# Patient Record
Sex: Male | Born: 2000 | Race: Black or African American | Hispanic: No | Marital: Single | Smoking: Never smoker
Health system: Southern US, Community
[De-identification: ages and names within clinical notes are randomized; demographics above are authoritative.]

---
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
## 2022-03-15 IMAGING — CR DG ANKLE COMPLETE 3+V*R*
3 series · 3 of 3 positions shown · non-contrast
Comparison: None.

CLINICAL DATA: 19-year-old male status post soccer injury 3 days
ago with continued lateral pain.

EXAM:
RIGHT ANKLE - COMPLETE 3+ VIEW

[x ankle ap right]
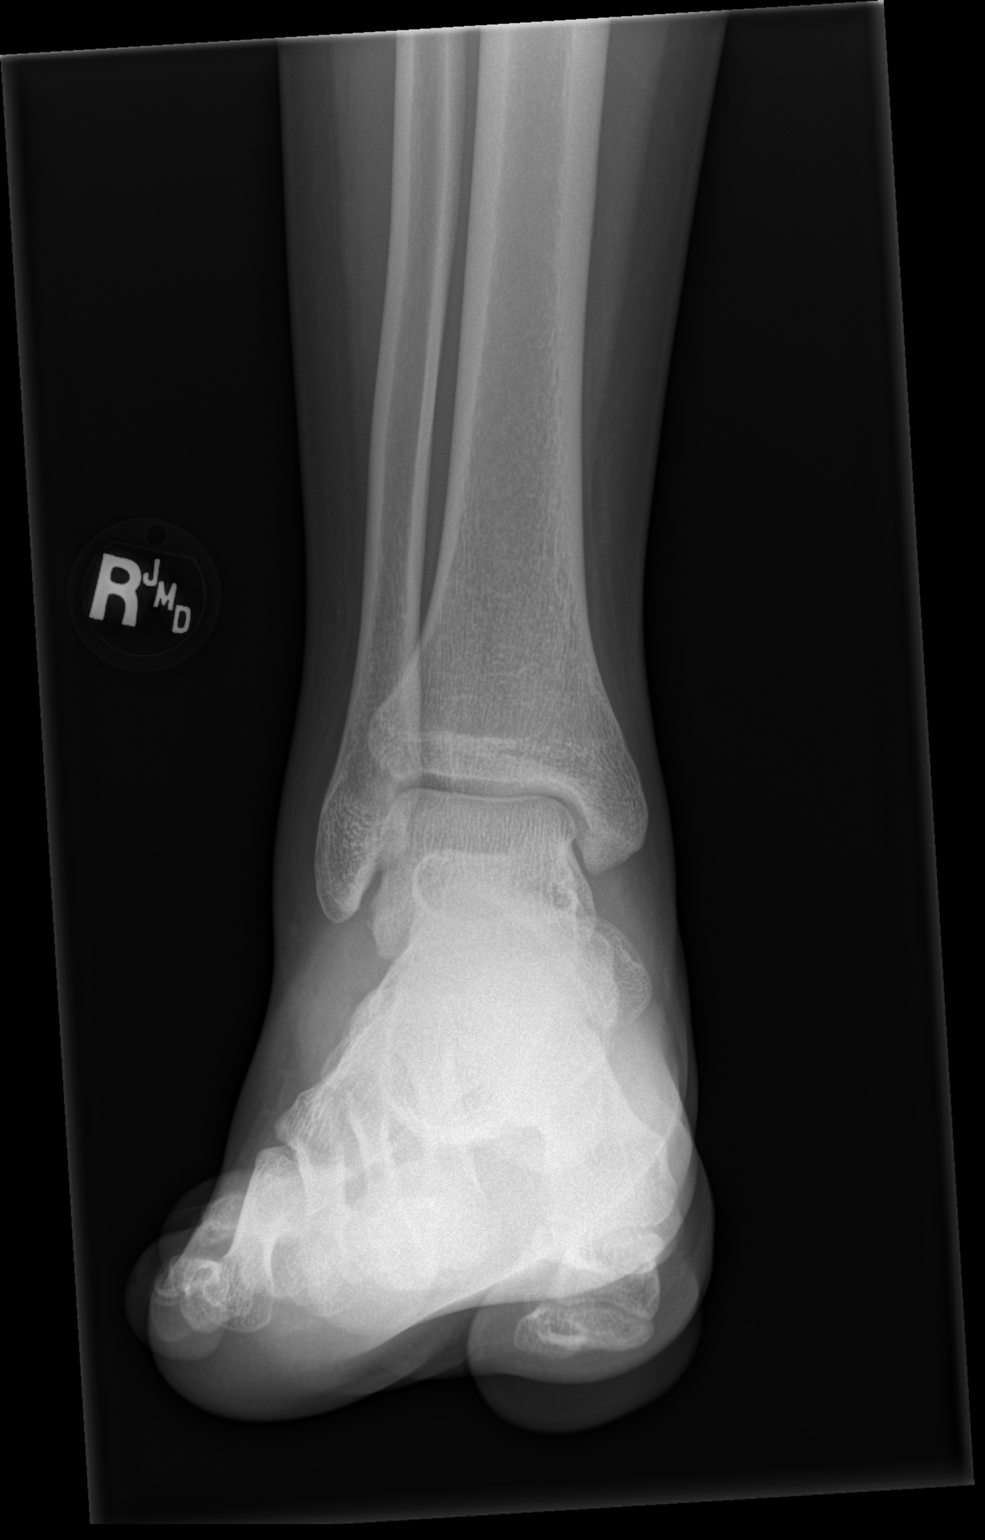

[x ankle obl right]
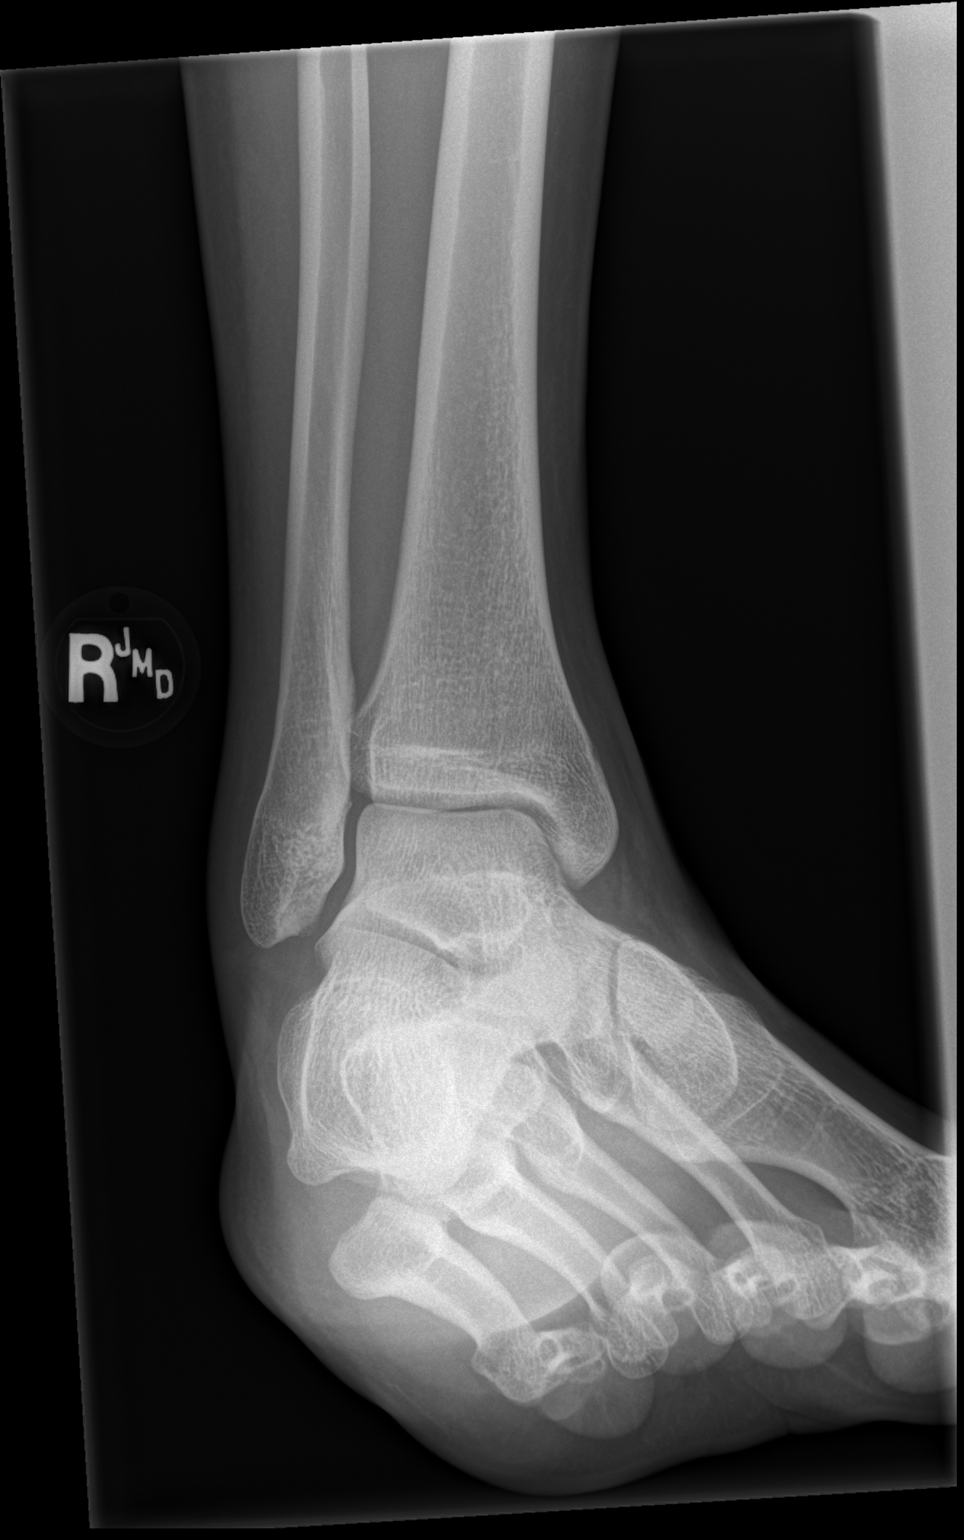

[x ankle lat right]
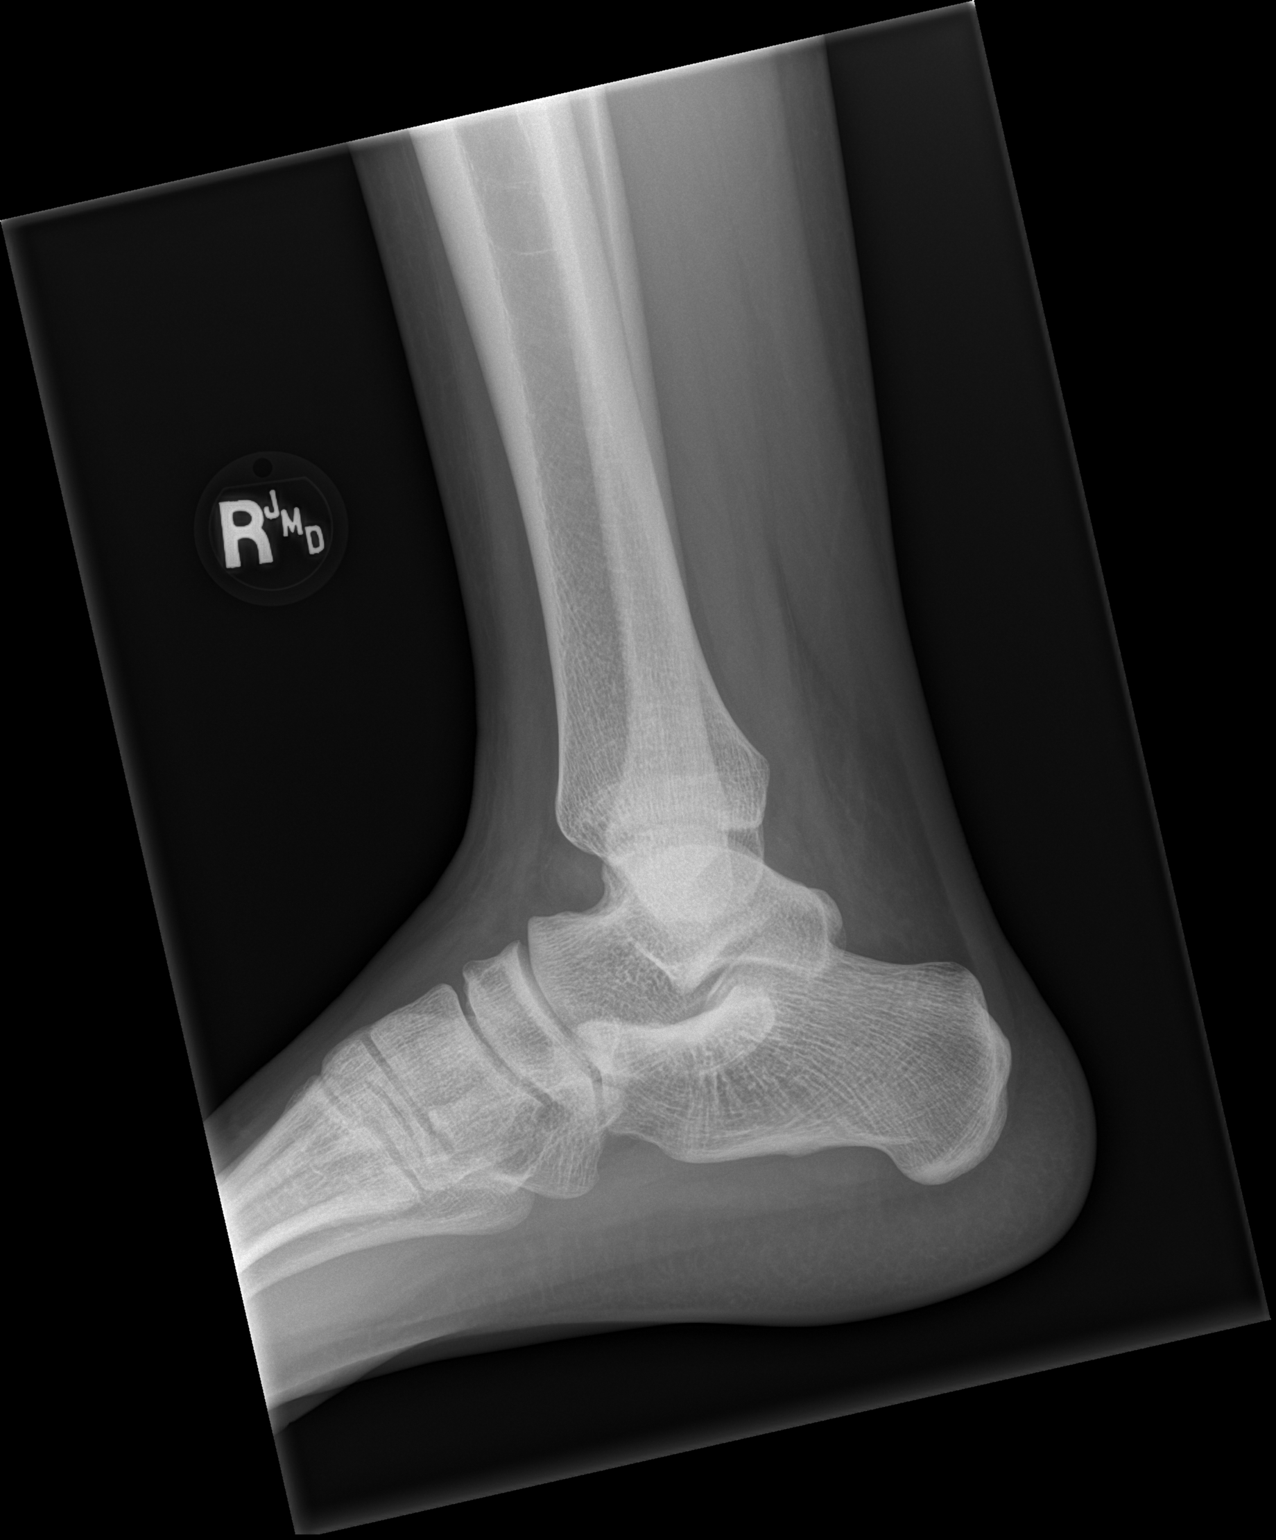

[3 of 3 positions shown; findings below may reference images not displayed]

FINDINGS: Skeletally mature. Bone mineralization is within normal limits.

Evidence of joint effusion on the lateral view. Mortise joint
alignment maintained. Talar dome appears intact. Asymmetric lateral
soft tissue swelling. The distal fibula appears intact. No talus,
distal tibia or calcaneus fracture identified. Visible bones of the
right foot also appear intact.
IMPRESSION: Lateral soft tissue swelling and probable ankle joint effusion with
no acute fracture or dislocation identified.
